# Patient Record
Sex: Female | Born: 1980 | Race: White | Hispanic: No | Smoking: Current every day smoker
Health system: Southern US, Community
[De-identification: ages and names within clinical notes are randomized; demographics above are authoritative.]

---

## 2016-01-03 ENCOUNTER — Emergency Department
Admission: EM | Admit: 2016-01-03 | Discharge: 2016-01-03 | Disposition: A | Discharge status: Home / Self Care | Payer: BLUE CROSS/BLUE SHIELD | Attending: Student | Admitting: Student

## 2016-01-03 ENCOUNTER — Encounter: Payer: Self-pay | Admitting: *Deleted

## 2016-01-03 DIAGNOSIS — K047 Periapical abscess without sinus: Secondary | ICD-10-CM | POA: Diagnosis not present

## 2016-01-03 DIAGNOSIS — F17200 Nicotine dependence, unspecified, uncomplicated: Secondary | ICD-10-CM | POA: Insufficient documentation

## 2016-01-03 DIAGNOSIS — K029 Dental caries, unspecified: Secondary | ICD-10-CM | POA: Diagnosis not present

## 2016-01-03 DIAGNOSIS — K0889 Other specified disorders of teeth and supporting structures: Secondary | ICD-10-CM | POA: Diagnosis present

## 2016-01-03 MED ORDER — TRAMADOL HCL 50 MG PO TABS: 50.0000 mg | ORAL_TABLET | Freq: Two times a day (BID) | ORAL | Status: DC

## 2016-01-03 MED ORDER — PENICILLIN V POTASSIUM 500 MG PO TABS: 500.0000 mg | ORAL_TABLET | Freq: Four times a day (QID) | ORAL | Status: DC

## 2016-01-03 MED ORDER — PENICILLIN V POTASSIUM 500 MG PO TABS
500.0000 mg | ORAL_TABLET | Freq: Four times a day (QID) | ORAL | Status: DC
  Administered 2016-01-03: 500 mg via ORAL
  Filled 2016-01-03: qty 1

## 2016-01-03 NOTE — ED Provider Notes (Signed)
[facility] Emergency Department Provider Note ____________________________________________  Time seen: 2159  I have reviewed the triage vital signs and the nursing notes.  HISTORY  Chief Complaint  Dental Pain  HPI Cheryl Golden is a 35 y.o. female presents to the ED for evaluation of dental pain and facial swelling that Saturday, 3 days prior to arrival. The patient admits to poor dentition and global dental caries, as well astooth decay down to the gumline. She describes that she had sudden onset of facial swelling on the left side a few days prior. She denies any interim fevers, chills, sweats. She also denies any spontaneous drainage at this time. She has a dental appointment with her provider but not until 3 weeks from today. She rates her overall discomfort at a 7/10 in triage.  History reviewed. No pertinent past medical history.  There are no active problems to display for this patient.  History reviewed. No pertinent past surgical history.  Current Outpatient Rx  Name  Route  Sig  Dispense  Refill  . penicillin v potassium (VEETID) 500 MG tablet   Oral   Take 1 tablet (500 mg total) by mouth 4 (four) times daily.   40 tablet   0   . traMADol (ULTRAM) 50 MG tablet   Oral   Take 1 tablet (50 mg total) by mouth 2 (two) times daily.   10 tablet   0    Allergies Review of patient's allergies indicates no known allergies.  History reviewed. No pertinent family history.  Social History Social History  Substance Use Topics  . Smoking status: Current Every Day Smoker -- 1.00 packs/day  . Smokeless tobacco: None  . Alcohol Use: No   Review of Systems  Constitutional: Negative for fever. Eyes: Negative for visual changes. ENT: Negative for sore throat. Dental pain and facial swelling as above Cardiovascular: Negative for chest pain. Respiratory: Negative for cough or congestion Musculoskeletal: Negative for jaw pain. Skin: Negative for  rash. ____________________________________________  PHYSICAL EXAM:  VITAL SIGNS: ED Triage Vitals  Enc Vitals Group     BP 01/03/16 2114 127/83 mmHg     Pulse Rate 01/03/16 2114 96     Resp 01/03/16 2114 18     Temp 01/03/16 2114 98.6 F (37 C)     Temp Source 01/03/16 2114 Oral     SpO2 01/03/16 2114 98 %     Weight 01/03/16 2114 138 lb (62.596 kg)     Height 01/03/16 2114  (1.6 m)     Head Cir --      Peak Flow --      Pain Score 01/03/16 2113 7     Pain Loc --      Pain Edu? --      Excl. in GC? --     Constitutional: Alert and oriented. Well appearing and in no distress. Head: Normocephalic and atraumatic. Eyes: Conjunctivae are normal. PERRL. Normal extraocular movements Ears: Canals clear. TMs intact bilaterally. Nose: No congestion/rhinorrhea. Mouth/Throat: Mucous membranes are moist. Uvula is midline. Tonsils are flat. Subtle swelling to the left nasolabial crease. Patient with pointing abscess with spontaneous drainage to the gum overlying the left upper canine. Global dental caries and tooth decay noted.  Neck: Supple. No thyromegaly. Hematological/Lymphatic/Immunological: No cervical lymphadenopathy. Respiratory: Normal respiratory effort.  Musculoskeletal: Nontender with normal range of motion in all extremities.  Skin:  Skin is warm, dry and intact. No rash noted. ____________________________________________  PROCEDURES  Pen VK 500 mg  PO ____________________________________________  INITIAL IMPRESSION / ASSESSMENT AND PLAN / ED COURSE  Patient with an acute dental abscess secondary to dental caries with spontaneous drainage. She'll be discharged with a prescription for Pen-Vee K and alternatives instructed. She will follow with her dental provider in 3 weeks as scheduled. Return to the ED as needed for any acutely worsening symptoms. ____________________________________________  FINAL CLINICAL IMPRESSION(S) / ED DIAGNOSES  Final diagnoses:  Dental  abscess  Dental caries      Lissa HoardJenise V Bacon Chason Mciver, PA-C 01/03/16 2224  Gayla DossEryka A Gayle, MD 01/03/16 [phone]

## 2016-01-03 NOTE — Discharge Instructions (Signed)
Dental Abscess A dental abscess is a collection of pus in or around a tooth. CAUSES This condition is caused by a bacterial infection around the root of the tooth that involves the inner part of the tooth (pulp). It may result from:  Severe tooth decay.  Trauma to the tooth that allows bacteria to enter into the pulp, such as a broken or chipped tooth.  Severe gum disease around a tooth. SYMPTOMS Symptoms of this condition include:  Severe pain in and around the infected tooth.  Swelling and redness around the infected tooth, in the mouth, or in the face.  Tenderness.  Pus drainage.  Bad breath.  Bitter taste in the mouth.  Difficulty swallowing.  Difficulty opening the mouth.  Nausea.  Vomiting.  Chills.  Swollen neck glands.  Fever. DIAGNOSIS This condition is diagnosed with examination of the infected tooth. During the exam, your dentist may tap on the infected tooth. Your dentist will also ask about your medical and dental history and may order X-rays. TREATMENT This condition is treated by eliminating the infection. This may be done with:  Antibiotic medicine.  A root canal. This may be performed to save the tooth.  Pulling (extracting) the tooth. This may also involve draining the abscess. This is done if the tooth cannot be saved. HOME CARE INSTRUCTIONS  Take medicines only as directed by your dentist.  If you were prescribed antibiotic medicine, finish all of it even if you start to feel better.  Rinse your mouth (gargle) often with salt water to relieve pain or swelling.  Do not drive or operate heavy machinery while taking pain medicine.  Do not apply heat to the outside of your mouth.  Keep all follow-up visits as directed by your dentist. This is important. SEEK MEDICAL CARE IF:  Your pain is worse and is not helped by medicine. SEEK IMMEDIATE MEDICAL CARE IF:  You have a fever or chills.  Your symptoms suddenly get worse.  You have a  very bad headache.  You have problems breathing or swallowing.  You have trouble opening your mouth.  You have swelling in your neck or around your eye.   This information is not intended to replace advice given to you by your health care provider. Make sure you discuss any questions you have with your health care provider.   Document Released: 08/27/2005 Document Revised: 01/11/2015 Document Reviewed: 08/24/2014 Elsevier Interactive Patient Education 2016 [employer]vier Inc[employer]  Dental Caries Dental caries is tooth decay. This decay can cause a hole in teeth (cavity) that can get bigger and deeper over time. HOME CARE  Brush and floss your teeth. Do this at least two times a day.  Use a fluoride toothpaste.  Use a mouth rinse if told by your dentist or doctor.  Eat less sugary and starchy foods. Drink less sugary drinks.  Avoid snacking often on sugary and starchy foods. Avoid sipping often on sugary drinks.  Keep regular checkups and cleanings with your dentist.  Use fluoride supplements if told by your dentist or doctor.  Allow fluoride to be applied to teeth if told by your dentist or doctor.   This information is not intended to replace advice given to you by your health care provider. Make sure you discuss any questions you have with your health care provider.   Document Released: 06/05/2008 Document Revised: 09/17/2014 Document Reviewed: 08/29/2012 Elsevier Interactive Patient Education 2016 [employer]  Dental Care and Dentist Visits Dental care supports good overall health.  Regular dental visits can also help you avoid dental pain, bleeding, infection, and other more serious health problems in the future. It is important to keep the mouth healthy because diseases in the teeth, gums, and other oral tissues can spread to other areas of the body. Some problems, such as diabetes, heart disease, and pre-term labor have been associated with poor oral health.  See your dentist  every 6 months. If you experience emergency problems such as a toothache or broken tooth, go to the dentist right away. If you see your dentist regularly, you may catch problems early. It is easier to be treated for problems in the early stages.  WHAT TO EXPECT AT A DENTIST VISIT  Your dentist will look for many common oral health problems and recommend proper treatment. At your regular dental visit, you can expect:  Gentle cleaning of the teeth and gums. This includes scraping and polishing. This helps to remove the sticky substance around the teeth and gums (plaque). Plaque forms in the mouth shortly after eating. Over time, plaque hardens on the teeth as tartar. If tartar is not removed regularly, it can cause problems. Cleaning also helps remove stains.  Periodic X-rays. These pictures of the teeth and supporting bone will help your dentist assess the health of your teeth.  Periodic fluoride treatments. Fluoride is a natural mineral shown to help strengthen teeth. Fluoride treatmentinvolves applying a fluoride gel or varnish to the teeth. It is most commonly done in children.  Examination of the mouth, tongue, jaws, teeth, and gums to look for any oral health problems, such as:  Cavities (dental caries). This is decay on the tooth caused by plaque, sugar, and acid in the mouth. It is best to catch a cavity when it is small.  Inflammation of the gums caused by plaque buildup (gingivitis).  Problems with the mouth or malformed or misaligned teeth.  Oral cancer or other diseases of the soft tissues or jaws. KEEP YOUR TEETH AND GUMS HEALTHY For healthy teeth and gums, follow these general guidelines as well as your dentist's specific advice:  Have your teeth professionally cleaned at the dentist every 6 months.  Brush twice daily with a fluoride toothpaste.  Floss your teeth daily.  Ask your dentist if you need fluoride supplements, treatments, or fluoride toothpaste.  Eat a healthy  diet. Reduce foods and drinks with added sugar.  Avoid smoking. TREATMENT FOR ORAL HEALTH PROBLEMS If you have oral health problems, treatment varies depending on the conditions present in your teeth and gums.  Your caregiver will most likely recommend good oral hygiene at each visit.  For cavities, gingivitis, or other oral health disease, your caregiver will perform a procedure to treat the problem. This is typically done at a separate appointment. Sometimes your caregiver will refer you to another dental specialist for specific tooth problems or for surgery. SEEK IMMEDIATE DENTAL CARE IF:  You have pain, bleeding, or soreness in the gum, tooth, jaw, or mouth area.  A permanent tooth becomes loose or separated from the gum socket.  You experience a blow or injury to the mouth or jaw area.   This information is not intended to replace advice given to you by your health care provider. Make sure you discuss any questions you have with your health care provider.   Document Released: 05/09/2011 Document Revised: 11/19/2011 Document Reviewed: 05/09/2011 Elsevier Interactive Patient Education [employer]  Follow-up with your dental provider as planned. Take the antibiotics until completely gone.

## 2016-01-03 NOTE — ED Notes (Addendum)
Pt to ED via POV with left sided dental pain. Pt talking complete full sentences, NAD noted. Vitals stable.

## 2016-05-01 ENCOUNTER — Encounter: Payer: Self-pay | Admitting: Emergency Medicine

## 2016-05-01 ENCOUNTER — Emergency Department
Admission: EM | Admit: 2016-05-01 | Discharge: 2016-05-01 | Disposition: A | Discharge status: Home / Self Care | Payer: BLUE CROSS/BLUE SHIELD | Attending: Emergency Medicine | Admitting: Emergency Medicine

## 2016-05-01 DIAGNOSIS — F17200 Nicotine dependence, unspecified, uncomplicated: Secondary | ICD-10-CM | POA: Diagnosis not present

## 2016-05-01 DIAGNOSIS — K0889 Other specified disorders of teeth and supporting structures: Secondary | ICD-10-CM | POA: Diagnosis present

## 2016-05-01 DIAGNOSIS — K029 Dental caries, unspecified: Secondary | ICD-10-CM | POA: Diagnosis not present

## 2016-05-01 MED ORDER — OXYCODONE-ACETAMINOPHEN 5-325 MG PO TABS: 1.0000 | ORAL_TABLET | ORAL | 0 refills | Status: AC | PRN

## 2016-05-01 MED ORDER — AMOXICILLIN 500 MG PO TABS: 500.0000 mg | ORAL_TABLET | Freq: Two times a day (BID) | ORAL | 0 refills | Status: AC

## 2016-05-01 MED ORDER — NAPROXEN 500 MG PO TABS: 500.0000 mg | ORAL_TABLET | Freq: Two times a day (BID) | ORAL | 0 refills | Status: AC

## 2016-05-01 NOTE — ED Provider Notes (Signed)
[facility] Emergency Department Provider Note  ____________________________________________  Time seen: Approximately 5:33 PM  I have reviewed the triage vital signs and the nursing notes.   HISTORY  Chief Complaint Dental Pain    HPI Cheryl Golden is a 35 y.o. female presents for evaluation of severe dental pain and swelling of the right side of her face. Patient reports having a dentist appointment scheduled for tomorrow but couldn't wait that long. Describes pain as 10 over 10 no difficulty swallowing.    History reviewed. No pertinent past medical history.  There are no active problems to display for this patient.   History reviewed. No pertinent surgical history.  Prior to Admission medications   Medication Sig Start Date End Date Taking? Authorizing Provider  amoxicillin (AMOXIL) 500 MG tablet Take 1 tablet (500 mg total) by mouth 2 (two) times daily. 05/01/16   Evangeline Dakinharles M Artis Beggs, PA-C  naproxen (NAPROSYN) 500 MG tablet Take 1 tablet (500 mg total) by mouth 2 (two) times daily with a meal. 05/01/16   Evangeline Dakinharles M Kalie Cabral, PA-C  oxyCODONE-acetaminophen (ROXICET) 5-325 MG tablet Take 1-2 tablets by mouth every 4 (four) hours as needed for severe pain. 05/01/16   Evangeline Dakinharles M Beth Goodlin, PA-C    Allergies Review of patient's allergies indicates no known allergies.  No family history on file.  Social History Social History  Substance Use Topics  . Smoking status: Current Every Day Smoker    Packs/day: 1.00  . Smokeless tobacco: Not on file  . Alcohol use No    Review of Systems Constitutional: No fever/chills ENT: No sore throat.Positive for facial swelling and dental pain. Cardiovascular: Denies chest pain. Respiratory: Denies shortness of breath. Musculoskeletal: Negative for back pain. Skin: Negative for rash. Neurological: Negative for headaches, focal weakness or numbness.  10-point ROS otherwise  negative.  ____________________________________________   PHYSICAL EXAM:  VITAL SIGNS: ED Triage Vitals  Enc Vitals Group     BP 05/01/16 1716 125/81     Pulse Rate 05/01/16 1716 91     Resp 05/01/16 1716 16     Temp 05/01/16 1716 99.1 F (37.3 C)     Temp Source 05/01/16 1716 Oral     SpO2 05/01/16 1716 96 %     Weight 05/01/16 1716 135 lb (61.2 kg)     Height 05/01/16 1716 5\' 3"  (1.6 m)     Head Circumference --      Peak Flow --      Pain Score 05/01/16 1719 8     Pain Loc --      Pain Edu? --      Excl. in GC? --     Constitutional: Alert and oriented. Well appearing and in no acute distress. Eyes: Conjunctivae are normal. PERRL. EOMI. Head: Positive facial swelling to the right lower jaw.  Nose: No congestion/rhinnorhea. Mouth/Throat: Mucous membranes are moist.  Oropharynx non-erythematous. Obvious dental caries throughout the entire lower jaw on the right side. Neck: No stridor. Supple, full range of motion, nontender. No cervical adenopathy noted. Musculoskeletal: No lower extremity tenderness nor edema.  No joint effusions. Neurologic:  Normal speech and language. No gross focal neurologic deficits are appreciated. No gait instability. Skin:  Skin is warm, dry and intact. No rash noted. Psychiatric: Mood and affect are normal. Speech and behavior are normal.  ____________________________________________   LABS (all labs ordered are listed, but only abnormal results are displayed)  Labs Reviewed - No data to display ____________________________________________  EKG  ____________________________________________  RADIOLOGY   ____________________________________________   PROCEDURES  Procedure(s) performed: None  Critical Care performed: No  ____________________________________________   INITIAL IMPRESSION / ASSESSMENT AND PLAN / ED COURSE  Pertinent labs & imaging results that were available during my care of the patient were reviewed by me  and considered in my medical decision making (see chart for details). Review of the [place] CSRS was performed in accordance of the [facility] prior to dispensing any controlled drugs.  Acute dental abscess. Rx given for amoxicillin and Percocet and Naprosyn. Patient follow-up with dental clinic as scheduled.  Clinical Course    ____________________________________________   FINAL CLINICAL IMPRESSION(S) / ED DIAGNOSES  Final diagnoses:  Pain, dental     This chart was dictated using voice recognition software/Dragon. Despite best efforts to proofread, errors can occur which can change the meaning. Any change was purely unintentional.    Evangeline Dakinharles M Brylin Stanislawski, PA-C 05/01/16 1814    Loleta Roseory Forbach, MD 05/01/16 [phone]

## 2016-05-01 NOTE — ED Notes (Signed)
States she developed some dental pain yesterday  Woke up with swelling to right jaw today

## 2016-05-01 NOTE — ED Triage Notes (Signed)
Pt reports dental abscess to right lower jaw, reports pain started last night and swelling started today. Pt's airway intact.

## 2019-03-26 ENCOUNTER — Other Ambulatory Visit: Payer: Self-pay

## 2019-03-26 DIAGNOSIS — Z20822 Contact with and (suspected) exposure to covid-19: Secondary | ICD-10-CM

## 2019-03-29 LAB — NOVEL CORONAVIRUS, NAA: SARS-CoV-2, NAA: NOT DETECTED

## 2019-04-01 ENCOUNTER — Telehealth: Payer: Self-pay | Admitting: General Practice

## 2019-04-01 NOTE — Telephone Encounter (Signed)
Pt given Covid - 19 test results. Spoke with pt directly °

## 2020-01-13 ENCOUNTER — Emergency Department
Admission: EM | Admit: 2020-01-13 | Discharge: 2020-01-13 | Disposition: A | Discharge status: Home / Self Care | Payer: Self-pay | Attending: Emergency Medicine | Admitting: Emergency Medicine

## 2020-01-13 ENCOUNTER — Other Ambulatory Visit: Payer: Self-pay

## 2020-01-13 ENCOUNTER — Emergency Department: Payer: Self-pay

## 2020-01-13 ENCOUNTER — Encounter: Payer: Self-pay | Admitting: Emergency Medicine

## 2020-01-13 DIAGNOSIS — Y999 Unspecified external cause status: Secondary | ICD-10-CM | POA: Insufficient documentation

## 2020-01-13 DIAGNOSIS — F17200 Nicotine dependence, unspecified, uncomplicated: Secondary | ICD-10-CM | POA: Insufficient documentation

## 2020-01-13 DIAGNOSIS — S62316A Displaced fracture of base of fifth metacarpal bone, right hand, initial encounter for closed fracture: Secondary | ICD-10-CM | POA: Insufficient documentation

## 2020-01-13 DIAGNOSIS — Y939 Activity, unspecified: Secondary | ICD-10-CM | POA: Insufficient documentation

## 2020-01-13 DIAGNOSIS — S62339A Displaced fracture of neck of unspecified metacarpal bone, initial encounter for closed fracture: Secondary | ICD-10-CM

## 2020-01-13 DIAGNOSIS — Y929 Unspecified place or not applicable: Secondary | ICD-10-CM | POA: Insufficient documentation

## 2020-01-13 DIAGNOSIS — W228XXA Striking against or struck by other objects, initial encounter: Secondary | ICD-10-CM | POA: Insufficient documentation

## 2020-01-13 MED ORDER — OXYCODONE-ACETAMINOPHEN 5-325 MG PO TABS: 1.0000 | ORAL_TABLET | ORAL | 0 refills | Status: AC | PRN

## 2020-01-13 MED ORDER — OXYCODONE-ACETAMINOPHEN 5-325 MG PO TABS
1.0000 | ORAL_TABLET | Freq: Once | ORAL | Status: AC
  Administered 2020-01-13: 1 via ORAL
  Filled 2020-01-13: qty 1

## 2020-01-13 MED ORDER — KETOROLAC TROMETHAMINE 60 MG/2ML IM SOLN
60.0000 mg | Freq: Once | INTRAMUSCULAR | Status: AC
Start: 2020-01-13 — End: 2020-01-13
  Administered 2020-01-13: 60 mg via INTRAMUSCULAR
  Filled 2020-01-13: qty 2

## 2020-01-13 NOTE — ED Provider Notes (Signed)
[facility] Emergency Department Provider Note  ____________________________________________   First MD Initiated Contact with Patient 01/13/20 [phone]     (approximate)  I have reviewed the triage vital signs and the nursing notes.   HISTORY  Chief Complaint Hand Injury   HPI Mandi Mattioli is a 39 y.o. female presents to the emergency department secondary to 10 out of 10 right hand pain and swelling after punching a table at 11:30 PM tonight.  Patient states that pain is worsened with any movement of the right hand.  Patient denies any wrist pain no elbow pain.      History reviewed. No pertinent past medical history.  There are no problems to display for this patient.   History reviewed. No pertinent surgical history.  Prior to Admission medications   Medication Sig Start Date End Date Taking? Authorizing Provider  amoxicillin (AMOXIL) 500 MG tablet Take 1 tablet (500 mg total) by mouth 2 (two) times daily. 05/01/16   Beers, Pierce Crane, PA-C  naproxen (NAPROSYN) 500 MG tablet Take 1 tablet (500 mg total) by mouth 2 (two) times daily with a meal. 05/01/16   Beers, Pierce Crane, PA-C  oxyCODONE-acetaminophen (ROXICET) 5-325 MG tablet Take 1-2 tablets by mouth every 4 (four) hours as needed for severe pain. 05/01/16   Arlyss Repress, PA-C    Allergies Patient has no known allergies.  No family history on file.  Social History Social History   Tobacco Use  . Smoking status: Current Every Day Smoker    Packs/day: 1.00  . Smokeless tobacco: Never Used  Substance Use Topics  . Alcohol use: No  . Drug use: Not on file    Review of Systems Constitutional: No fever/chills Eyes: No visual changes. ENT: No sore throat. Cardiovascular: Denies chest pain. Respiratory: Denies shortness of breath. Gastrointestinal: No abdominal pain.  No nausea, no vomiting.  No diarrhea.  No constipation. Genitourinary: Negative for dysuria. Musculoskeletal: Negative  for neck pain.  Negative for back pain.  Positive for right hand pain and swelling Integumentary: Negative for rash. Neurological: Negative for headaches, focal weakness or numbness.   ____________________________________________   PHYSICAL EXAM:  VITAL SIGNS: ED Triage Vitals  Enc Vitals Group     BP 01/13/20 0137 (!) 142/88     Pulse Rate 01/13/20 0137 83     Resp 01/13/20 0137 18     Temp 01/13/20 0137 98.4 F (36.9 C)     Temp Source 01/13/20 0137 Oral     SpO2 01/13/20 0137 98 %     Weight 01/13/20 0137 74.1 kg (163 lb 5.8 oz)     Height 01/13/20 0137 1.6 m (5\' 3" )     Head Circumference --      Peak Flow --      Pain Score 01/13/20 0142 10     Pain Loc --      Pain Edu? --      Excl. in [place]? --     Constitutional: Alert and oriented.  Eyes: Conjunctivae are normal.  Mouth/Throat: Patient is wearing a mask. Neck: No stridor.  No meningeal signs.   Cardiovascular: Normal rate, regular rhythm. Good peripheral circulation. Grossly normal heart sounds. Musculoskeletal: Pain swelling noted over the right fifth metacarpal.  Pain with active and passive range of motion of the hand.  No pain with active and passive range of motion of the wrist.  Neurovascularly intact Neurologic:  Normal speech and language. No gross focal neurologic deficits are appreciated.  Skin:  Skin is warm, dry and intact. Psychiatric: Mood and affect are normal. Speech and behavior are normal.  ____________________________________________ _________________________  RADIOLOGY I, Darci Current, personally viewed and evaluated these images (plain radiographs) as part of my medical decision making, as well as reviewing the written report by the radiologist.  ED MD interpretation: Fifth metacarpal fracture with dorsal apical angulation 40 degrees on right hand x-ray per radiologist.  Official radiology report(s): DG Hand Complete Right  Result Date: 01/13/2020 CLINICAL DATA:  Injury, punched a  table EXAM: RIGHT HAND - COMPLETE 3+ VIEW COMPARISON:  None. FINDINGS: Fracture of the mid diaphysis of the fifth metacarpal with and dorsal apical angulation of approximately 40 degrees. Dorsal and lateral soft tissue swelling is present. No other acute fracture or traumatic malalignment. IMPRESSION: Fifth metacarpal fracture with dorsal apical angulation of 40 degrees. Electronically Signed   By: Kreg Shropshire M.D.   On: 01/13/2020 03:02    ________  Procedures   ____________________________________________   INITIAL IMPRESSION / MDM / ASSESSMENT AND PLAN / ED COURSE  As part of my medical decision making, I reviewed the following data within the electronic MEDICAL RECORD NUMBER   39 year old female presented with above-stated history review of system differential diagnosis including but not limited to carpal or metacarpal fracture, dislocation.  X-ray revealed evidence of a boxer's fracture with angulation.  Splint was applied patient given Toradol and Percocet in the emergency department will be prescribed Percocet for home.  Patient advised not to drive or operate machinery while taking Percocet.  Patient referred to orthopedic surgery Dr. Martha Clan for further outpatient evaluation and management. ____________________________________________  FINAL CLINICAL IMPRESSION(S) / ED DIAGNOSES  Final diagnoses:  Closed boxer's fracture, initial encounter     MEDICATIONS GIVEN DURING THIS VISIT:  Medications  oxyCODONE-acetaminophen (PERCOCET/ROXICET) 5-325 MG per tablet 1 tablet (1 tablet Oral Given 01/13/20 0445)  ketorolac (TORADOL) injection 60 mg (60 mg Intramuscular Given 01/13/20 0440)     ED Discharge Orders    None      *Please note:  Armani Gawlik was evaluated in Emergency Department on 01/13/2020 for the symptoms described in the history of present illness. She was evaluated in the context of the global COVID-19 pandemic, which necessitated consideration that the patient might be at  risk for infection with the SARS-CoV-2 virus that causes COVID-19. Institutional protocols and algorithms that pertain to the evaluation of patients at risk for COVID-19 are in a state of rapid change based on information released by regulatory bodies including the CDC and federal and state organizations. These policies and algorithms were followed during the patient's care in the ED.  Some ED evaluations and interventions may be delayed as a result of limited staffing during the pandemic.*  Note:  This document was prepared using Dragon voice recognition software and may include unintentional dictation errors.   Darci Current, MD 01/13/20 [phone]

## 2020-01-13 NOTE — ED Triage Notes (Signed)
Patient ambulatory to triage with steady gait, without difficulty or distress noted, mask in place; pt reports approx 1130pm, got mad an punched a table; c/o pain/swelling to rt hand since

## 2021-05-02 ENCOUNTER — Other Ambulatory Visit: Payer: Self-pay

## 2021-05-02 ENCOUNTER — Emergency Department
Admission: EM | Admit: 2021-05-02 | Discharge: 2021-05-02 | Disposition: A | Discharge status: Home / Self Care | Payer: Self-pay | Attending: Emergency Medicine | Admitting: Emergency Medicine

## 2021-05-02 ENCOUNTER — Encounter: Payer: Self-pay | Admitting: Emergency Medicine

## 2021-05-02 DIAGNOSIS — G43909 Migraine, unspecified, not intractable, without status migrainosus: Secondary | ICD-10-CM | POA: Insufficient documentation

## 2021-05-02 DIAGNOSIS — G43009 Migraine without aura, not intractable, without status migrainosus: Secondary | ICD-10-CM

## 2021-05-02 DIAGNOSIS — F17210 Nicotine dependence, cigarettes, uncomplicated: Secondary | ICD-10-CM | POA: Insufficient documentation

## 2021-05-02 LAB — CBG MONITORING, ED: Glucose-Capillary: 109 mg/dL — ABNORMAL HIGH (ref 70–99)

## 2021-05-02 MED ORDER — PROMETHAZINE HCL 25 MG PO TABS: 25.0000 mg | ORAL_TABLET | Freq: Four times a day (QID) | ORAL | 0 refills | Status: AC | PRN

## 2021-05-02 MED ORDER — PROCHLORPERAZINE EDISYLATE 10 MG/2ML IJ SOLN
10.0000 mg | Freq: Once | INTRAMUSCULAR | Status: DC
Start: 2021-05-02 — End: 2021-05-02
  Filled 2021-05-02: qty 2

## 2021-05-02 MED ORDER — KETOROLAC TROMETHAMINE 30 MG/ML IJ SOLN
30.0000 mg | Freq: Once | INTRAMUSCULAR | Status: DC
  Filled 2021-05-02: qty 1

## 2021-05-02 MED ORDER — SUMATRIPTAN SUCCINATE 50 MG PO TABS: 50.0000 mg | ORAL_TABLET | Freq: Once | ORAL | 0 refills | Status: AC | PRN

## 2021-05-02 MED ORDER — SODIUM CHLORIDE 0.9 % IV BOLUS (SEPSIS): 1000.0000 mL | Freq: Once | INTRAVENOUS | Status: DC

## 2021-05-02 MED ORDER — BUTALBITAL-APAP-CAFFEINE 50-325-40 MG PO TABS: 1.0000 | ORAL_TABLET | Freq: Four times a day (QID) | ORAL | 0 refills | Status: AC | PRN

## 2021-05-02 NOTE — ED Provider Notes (Signed)
[facility] Emergency Department Provider Note  ____________________________________________   Event Date/Time   First MD Initiated Contact with Patient 05/02/21 [phone]     (approximate)  I have reviewed the triage vital signs and the nursing notes.   HISTORY  Chief Complaint Migraine    HPI Cheryl Golden is a 40 y.o. female with history of migraine headaches who presents to the emergency department with complaints of left gradual onset, severe, throbbing headache for the past 3 to 4 days that feels similar to her previous migraines.  States the pain is causing her to have blurry vision.  No vision loss or diplopia.  Denies numbness, tingling or focal weakness.  No fevers, head injury.  Not on blood thinners.  Pain is worse with lights and sounds.  She has associated nausea without vomiting.  She has had migraines for years.  States she has tried ibuprofen at home without relief.        History reviewed. No pertinent past medical history.  There are no problems to display for this patient.   History reviewed. No pertinent surgical history.  Prior to Admission medications   Medication Sig Start Date End Date Taking? Authorizing Provider  butalbital-acetaminophen-caffeine (FIORICET) 50-325-40 MG tablet Take 1-2 tablets by mouth every 6 (six) hours as needed for headache. 05/02/21 05/02/22 Yes Madolyn Ackroyd, Layla Maw, DO  promethazine (PHENERGAN) 25 MG tablet Take 1 tablet (25 mg total) by mouth every 6 (six) hours as needed for nausea or vomiting. 05/02/21  Yes Arneta Mahmood, Layla Maw, DO  SUMAtriptan (IMITREX) 50 MG tablet Take 1 tablet (50 mg total) by mouth once as needed for migraine. May repeat in 2 hours if headache persists or recurs. 05/02/21 05/03/22 Yes Gerda Yin, Layla Maw, DO  amoxicillin (AMOXIL) 500 MG tablet Take 1 tablet (500 mg total) by mouth 2 (two) times daily. 05/01/16   Beers, Charmayne Sheer, PA-C  naproxen (NAPROSYN) 500 MG tablet Take 1 tablet (500 mg total) by mouth  2 (two) times daily with a meal. 05/01/16   Beers, Charmayne Sheer, PA-C  oxyCODONE-acetaminophen (ROXICET) 5-325 MG tablet Take 1-2 tablets by mouth every 4 (four) hours as needed for severe pain. 05/01/16   Evangeline Dakin, PA-C    Allergies Patient has no known allergies.  No family history on file.  Social History Social History   Tobacco Use   Smoking status: Every Day    Packs/day: 1.00    Types: Cigarettes   Smokeless tobacco: Never  Vaping Use   Vaping Use: Never used  Substance Use Topics   Alcohol use: No    Review of Systems Constitutional: No fever. Eyes: No visual changes. ENT: No sore throat. Cardiovascular: Denies chest pain. Respiratory: Denies shortness of breath. Gastrointestinal: No nausea, vomiting, diarrhea. Genitourinary: Negative for dysuria. Musculoskeletal: Negative for back pain. Skin: Negative for rash. Neurological: Negative for focal weakness or numbness.  ____________________________________________   PHYSICAL EXAM:  VITAL SIGNS: ED Triage Vitals  Enc Vitals Group     BP 05/02/21 0341 (!) 140/93     Pulse Rate 05/02/21 0341 91     Resp 05/02/21 0341 18     Temp 05/02/21 0341 98 F (36.7 C)     Temp Source 05/02/21 0341 Oral     SpO2 05/02/21 0341 95 %     Weight 05/02/21 0339 165 lb (74.8 kg)     Height 05/02/21 0339 5\' 3"  (1.6 m)     Head Circumference --  Peak Flow --      Pain Score 05/02/21 0338 10     Pain Loc --      Pain Edu? --      Excl. in [place]? --    CONSTITUTIONAL: Alert and oriented and responds appropriately to questions. Well-appearing; well-nourished HEAD: Normocephalic, atraumatic EYES: Conjunctivae clear, pupils appear equal, EOM appear intact ENT: normal nose; moist mucous membranes NECK: Supple, normal ROM, no meningismus CARD: RRR; S1 and S2 appreciated; no murmurs, no clicks, no rubs, no gallops RESP: Normal chest excursion without splinting or tachypnea; breath sounds clear and equal bilaterally; no  wheezes, no rhonchi, no rales, no hypoxia or respiratory distress, speaking full sentences ABD/GI: Normal bowel sounds; non-distended; soft, non-tender, no rebound, no guarding, no peritoneal signs, no hepatosplenomegaly BACK: The back appears normal EXT: Normal ROM in all joints; no deformity noted, no edema; no cyanosis SKIN: Normal color for age and race; warm; no rash on exposed skin NEURO: Moves all extremities equally, normal sensation diffusely, strength 5/5 all 4 extremities, cranial nerves II through XII intact, normal speech, normal gait PSYCH: The patient's mood and manner are appropriate.  ____________________________________________   LABS (all labs ordered are listed, but only abnormal results are displayed)  Labs Reviewed  CBG MONITORING, ED   ____________________________________________  EKG   ____________________________________________  RADIOLOGY I, Juliane Guest, personally viewed and evaluated these images (plain radiographs) as part of my medical decision making, as well as reviewing the written report by the radiologist.  ED MD interpretation:    Official radiology report(s): No results found.  ____________________________________________   PROCEDURES  Procedure(s) performed (including Critical Care):  Procedures   ____________________________________________   INITIAL IMPRESSION / ASSESSMENT AND PLAN / ED COURSE  As part of my medical decision making, I reviewed the following data within the electronic MEDICAL RECORD NUMBER Nursing notes reviewed and incorporated, Old chart reviewed, Patient signed out to oncoming ED physician, Notes from prior ED visits, and [place] Controlled Substance Database         Patient here with migraine headache.  Complains of blurry vision.  States this is new for her.  No other focal neurologic deficits.  Will check visual acuity, CBG and urine pregnancy test.  Suspect this is due to her migraine headache.  History of  similar headaches.  Will give IV fluids, Compazine, Toradol.  Low suspicion for intracranial hemorrhage, stroke, CVT, meningitis.  ED PROGRESS  Patient now declines any IV pain medication stating that she just wants a prescription for something she can take at home and go to sleep.  She agrees to CBG and visual acuity but declines pregnancy testing stating that there is no way that she is pregnant.  Will give outpatient follow-up.  Will provide with prescriptions for Fioricet, Imitrex, Phenergan to take as needed.  Discussed return precautions.  Have again offered her medications here which she declines.  At this time, I do not feel there is any life-threatening condition present. I have reviewed, interpreted and discussed all results (EKG, imaging, lab, urine as appropriate) and exam findings with patient/family. I have reviewed nursing notes and appropriate previous records.  I feel the patient is safe to be discharged home without further emergent workup and can continue workup as an outpatient as needed. Discussed usual and customary return precautions. Patient/family verbalize understanding and are comfortable with this plan.  Outpatient follow-up has been provided as needed. All questions have been answered.  ____________________________________________   FINAL CLINICAL IMPRESSION(S) / ED DIAGNOSES  Final diagnoses:  Migraine without aura and without status migrainosus, not intractable     ED Discharge Orders          Ordered    butalbital-acetaminophen-caffeine (FIORICET) 50-325-40 MG tablet  Every 6 hours PRN        05/02/21 0726    promethazine (PHENERGAN) 25 MG tablet  Every 6 hours PRN        05/02/21 0726    SUMAtriptan (IMITREX) 50 MG tablet  Once PRN        05/02/21 9233            *Please note:  Reighn Kaplan was evaluated in Emergency Department on 05/02/2021 for the symptoms described in the history of present illness. She was evaluated in the context of the global  COVID-19 pandemic, which necessitated consideration that the patient might be at risk for infection with the SARS-CoV-2 virus that causes COVID-19. Institutional protocols and algorithms that pertain to the evaluation of patients at risk for COVID-19 are in a state of rapid change based on information released by regulatory bodies including the CDC and federal and state organizations. These policies and algorithms were followed during the patient's care in the ED.  Some ED evaluations and interventions may be delayed as a result of limited staffing during and the pandemic.*   Note:  This document was prepared using Dragon voice recognition software and may include unintentional dictation errors.    Aurorah Schlachter, Layla Maw, DO 05/02/21 [phone]

## 2021-05-02 NOTE — ED Notes (Signed)
Pt refused meds MD Ward aware

## 2021-05-02 NOTE — ED Triage Notes (Signed)
Patient ambulatory to triage with steady gait, without difficulty or distress noted; pt reports left sided migraine x 3 days accomp by nausea & blurred vision; st hx of same

## 2021-05-02 NOTE — Discharge Instructions (Addendum)
Steps to find a Primary Care Provider (PCP):  Call [phone] or [phone] to access "[facility] Find a Doctor Service."  2.  You may also go on the [facility] website at [email or web address]  

## 2021-05-02 NOTE — ED Notes (Signed)
Visual Acuity L-20/50 R-20/25

## 2021-10-17 ENCOUNTER — Emergency Department: Payer: BC Managed Care – PPO

## 2021-10-17 ENCOUNTER — Other Ambulatory Visit: Payer: Self-pay

## 2021-10-17 ENCOUNTER — Emergency Department
Admission: EM | Admit: 2021-10-17 | Discharge: 2021-10-17 | Disposition: A | Discharge status: Home / Self Care | Payer: BC Managed Care – PPO | Attending: Emergency Medicine | Admitting: Emergency Medicine

## 2021-10-17 DIAGNOSIS — M25512 Pain in left shoulder: Secondary | ICD-10-CM | POA: Insufficient documentation

## 2021-10-17 MED ORDER — CYCLOBENZAPRINE HCL 5 MG PO TABS: 5.0000 mg | ORAL_TABLET | Freq: Three times a day (TID) | ORAL | 0 refills | Status: AC | PRN

## 2021-10-17 NOTE — ED Notes (Signed)
See triage note. Pt states sx onset approx 2-3 weeks ago. No specific identifiable MOI. Pain begins behind left shoulder blade and radiates down arm and causes temporary "numbness" to digits 1 through 4 but the thumb retains normal sensation. Denies any previous Hx of injury to affected area.

## 2021-10-17 NOTE — ED Triage Notes (Signed)
Pt reports left shoulder pain with certain movements x2 months. Reports thinks has pinched nerve.

## 2021-10-17 NOTE — Discharge Instructions (Addendum)
-  Take Tylenol/ibuprofen as needed for pain.  Use cyclobenzaprine as needed -Follow-up with the orthopedist listed above. -Return to the emergency department at any time if you begin to experience any new or worsening pain

## 2021-10-17 NOTE — ED Provider Notes (Signed)
[facility] Provider Note    Event Date/Time   First MD Initiated Contact with Patient 10/17/21 1204     (approximate)   History   Chief Complaint Shoulder Pain   HPI Cheryl Golden is a 41 y.o. female, no remarkable medical history, presents to the emergency department for evaluation of shoulder pain.  The patient states that she has been having left-sided shoulder pain for the past 2 months.  She states that it has progressively worsened over time.  It primarily hurts around the posterior aspect of her shoulder underneath her scapula that is exacerbated by movement.  She states from time to time, she develops numbness from her left elbow down to her fingers as well.  She states that she has been taken Tylenol as needed for the pain with minimal relief.  Denies fever/chills, elbow/forearm pain, hand pain, chest pain, shortness of breath, midline spinal pain, decreased strength, or recent weight loss.  History Limitations: No limitations.      Physical Exam  Triage Vital Signs: ED Triage Vitals  Enc Vitals Group     BP 10/17/21 1146 114/87     Pulse Rate 10/17/21 1146 87     Resp 10/17/21 1146 16     Temp 10/17/21 1146 98.2 F (36.8 C)     Temp src --      SpO2 10/17/21 1146 98 %     Weight 10/17/21 1147 150 lb (68 kg)     Height 10/17/21 1147 5\' 3"  (1.6 m)     Head Circumference --      Peak Flow --      Pain Score 10/17/21 1148 7     Pain Loc --      Pain Edu? --      Excl. in [place]? --     Most recent vital signs: Vitals:   10/17/21 1146 10/17/21 1307  BP: 114/87 120/83  Pulse: 87 85  Resp: 16 17  Temp: 98.2 F (36.8 C)   SpO2: 98% 99%    General: Awake, NAD.  CV: Good peripheral perfusion.  Resp: Normal effort.  Abd: Soft, non-tender. No distention.  Neuro: At baseline. No gross neurological deficits. Other: No gross deformities in the left upper extremity.  Normal abduction/abduction.  No tenderness over the biceps tendon insertion  point.  Patient does have some notable tightness and tenderness in the musculature between the scapula and the thoracic spine.  Pulse, motor, sensation intact distally  Physical Exam    ED Results / Procedures / Treatments  Labs (all labs ordered are listed, but only abnormal results are displayed) Labs Reviewed - No data to display   EKG Not applicable.   RADIOLOGY  ED Provider Interpretation: I personally reviewed and interpreted this image.  No evidence of fracture or dislocation.  DG Shoulder Left  Result Date: 10/17/2021 CLINICAL DATA:  Left shoulder pain and numbness. EXAM: LEFT SHOULDER - 2+ VIEW COMPARISON:  None. FINDINGS: There is no evidence of fracture or dislocation. There is no evidence of arthropathy or other focal bone abnormality. Soft tissues are unremarkable. IMPRESSION: Negative. Electronically Signed   By: Titus Dubin M.D.   On: 10/17/2021 12:12    PROCEDURES:  Critical Care performed: None.  Procedures    MEDICATIONS ORDERED IN ED: Medications - No data to display   IMPRESSION / MDM / [place] / ED COURSE  I reviewed the triage vital signs and the nursing notes.  Cheryl Golden is a 41 y.o. female, no remarkable medical history, presents to the emergency department for evaluation of shoulder pain.  The patient states that she has been having left-sided shoulder pain for the past 2 months.  She states that it has progressively worsened over time.  It primarily hurts around the posterior aspect of her shoulder underneath her scapula that is exacerbated by movement.  She states from time to time, she develops numbness from her left elbow down to her fingers as well.  She states that she has been taken Tylenol as needed for the pain with minimal relief.   Differential diagnosis includes, but is not limited to, thoracic radiculopathy, rotator cuff injury, bursitis, scapula fracture, shoulder  dislocation/subluxation.  ED Course Patient appears well.  Vital signs within normal limits.  She is afebrile  Assessment/Plan History and physical exam consistent with possible thoracic radiculopathy versus musculoskeletal strain.  X-rays reassuring for no evidence of fractures or dislocations in the shoulder joint. Patient does not have any midline spinal tenderness.  We will provide the patient a prescription for cyclobenzaprine and provide referral to orthopedics.  We will additionally provide patient a note for work.  We will plan to discharge this patient.  Patient was provided with anticipatory guidance, return precautions, and educational material. Encouraged the patient to return to the emergency department at any time if they begin to experience any new or worsening symptoms.       FINAL CLINICAL IMPRESSION(S) / ED DIAGNOSES   Final diagnoses:  Left shoulder pain, unspecified chronicity     Rx / DC Orders   ED Discharge Orders          Ordered    cyclobenzaprine (FLEXERIL) 5 MG tablet  3 times daily PRN        10/17/21 1250             Note:  This document was prepared using Dragon voice recognition software and may include unintentional dictation errors.   Teodoro Spray, [place] 10/17/21 1904    Harvest Dark, MD 10/20/21 2059

## 2022-06-09 IMAGING — CR DG SHOULDER 2+V*L*
3 series · 3 of 3 positions shown · non-contrast
Comparison: None.

CLINICAL DATA: Left shoulder pain and numbness.

EXAM:
LEFT SHOULDER - 2+ VIEW

[shoulder grashey]
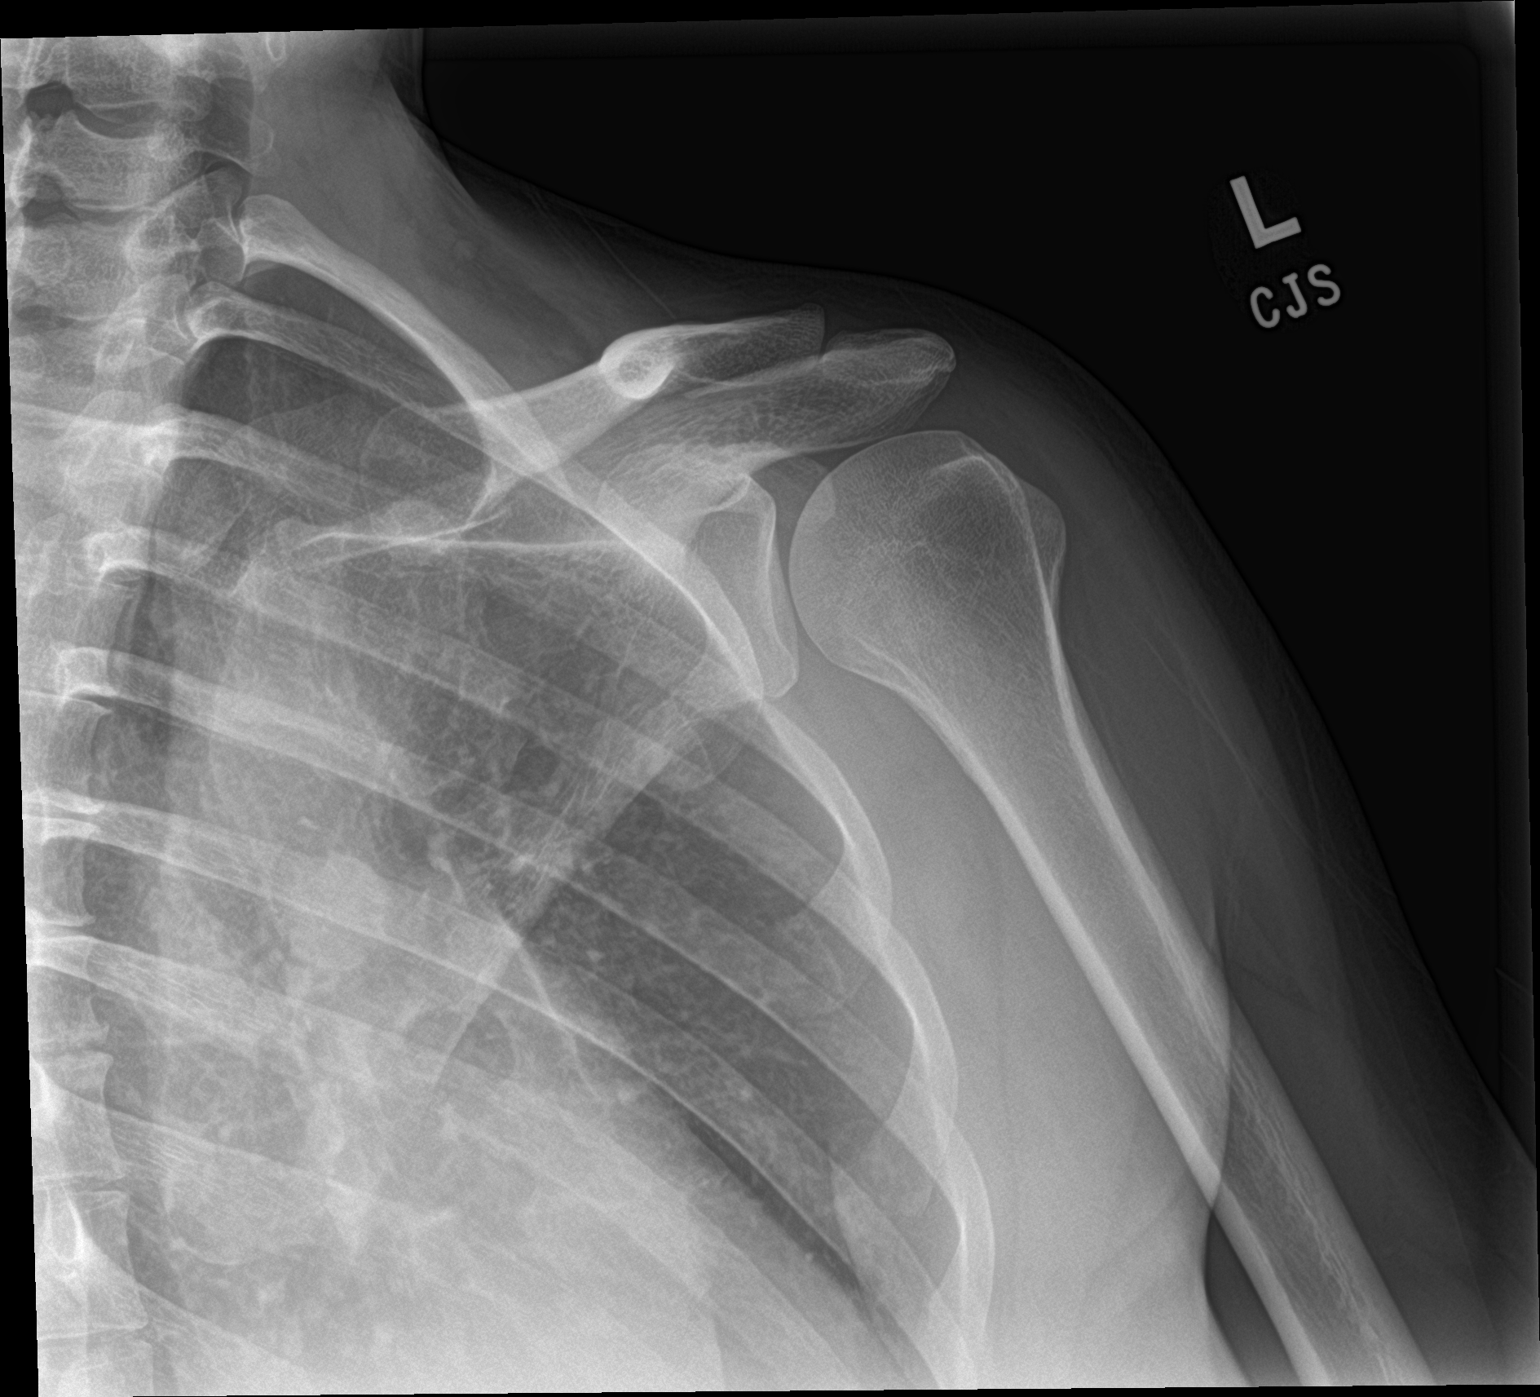

[shoulder y view]
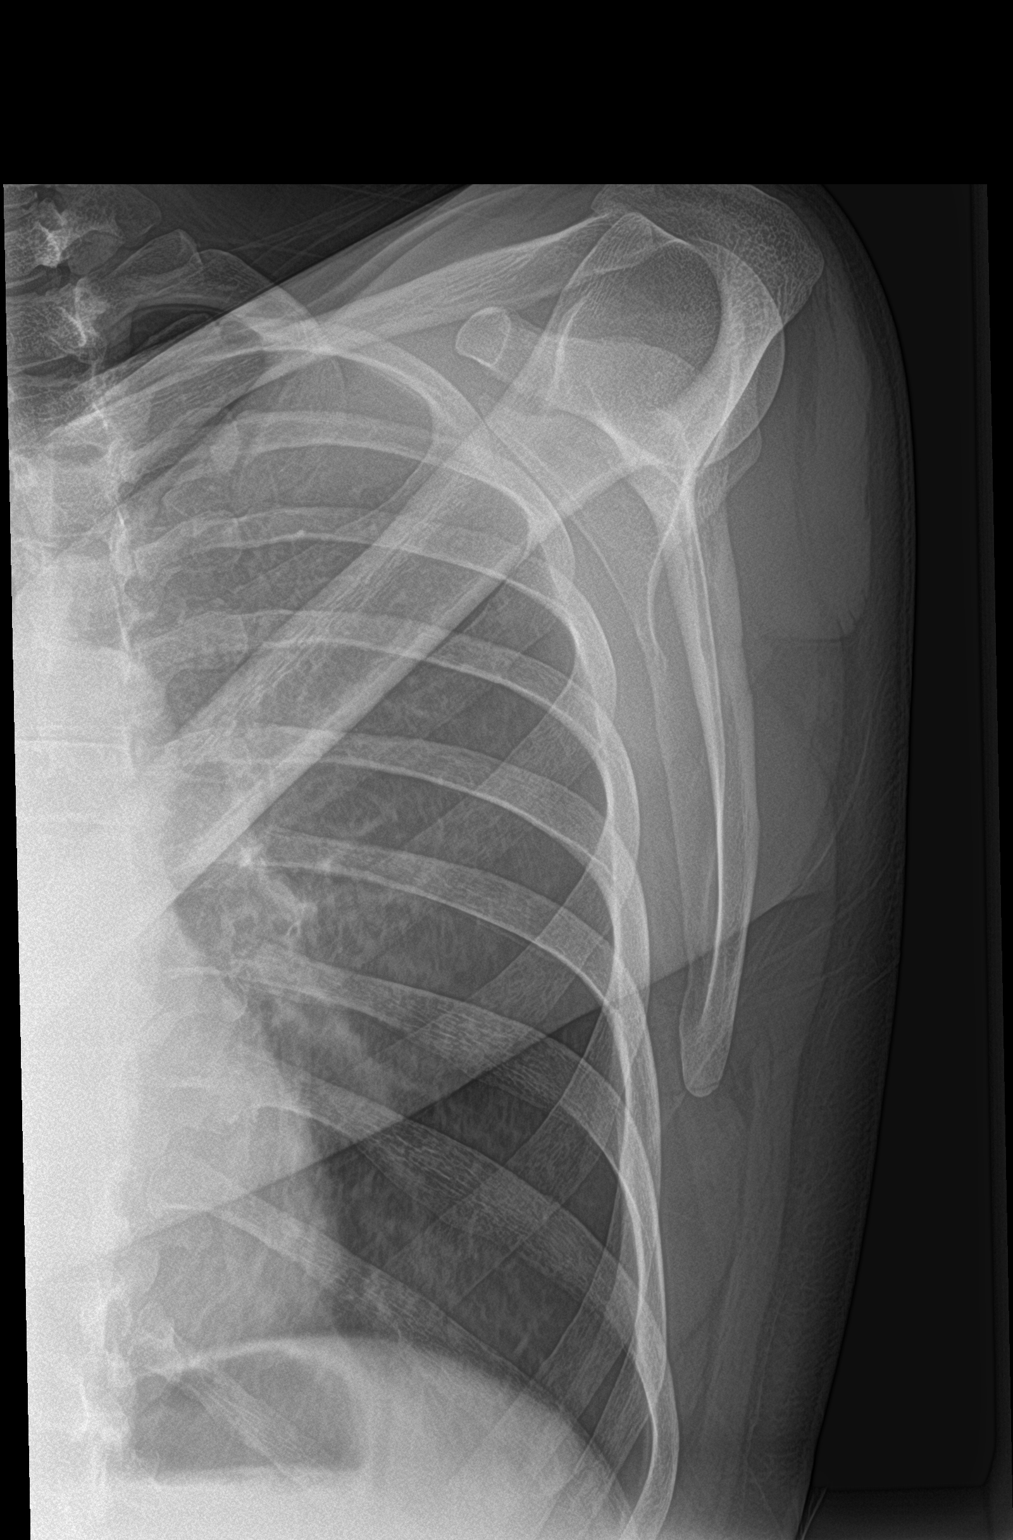

[shoulder axillary]
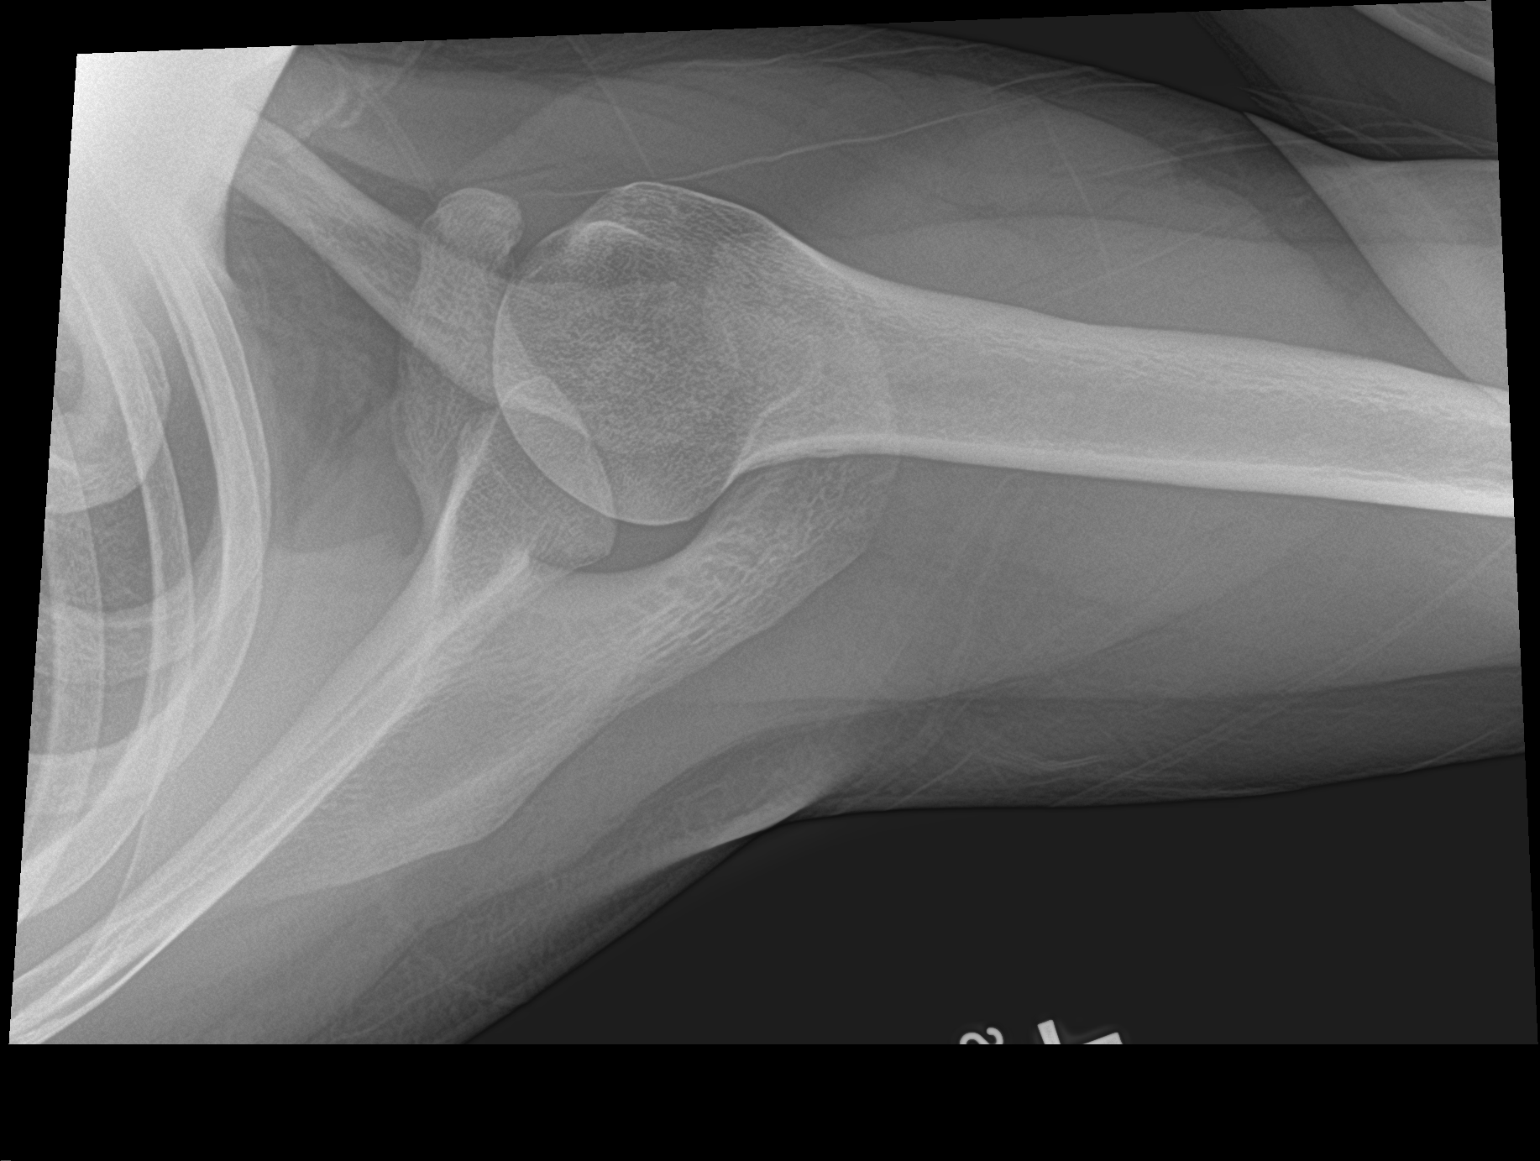

[3 of 3 positions shown; findings below may reference images not displayed]

FINDINGS: There is no evidence of fracture or dislocation. There is no
evidence of arthropathy or other focal bone abnormality. Soft
tissues are unremarkable.
IMPRESSION: Negative.
